# Patient Record
Sex: Female | Born: 1980 | Race: White | Hispanic: No | Marital: Single | Smoking: Never smoker
Health system: Southern US, Community
[De-identification: ages and names within clinical notes are randomized; demographics above are authoritative.]

## PROBLEM LIST (undated history)

## (undated) DIAGNOSIS — K219 Gastro-esophageal reflux disease without esophagitis: Secondary | ICD-10-CM

## (undated) DIAGNOSIS — C801 Malignant (primary) neoplasm, unspecified: Secondary | ICD-10-CM

## (undated) DIAGNOSIS — I8390 Asymptomatic varicose veins of unspecified lower extremity: Secondary | ICD-10-CM

## (undated) HISTORY — DX: Asymptomatic varicose veins of unspecified lower extremity: I83.90

## (undated) HISTORY — PX: TONSILECTOMY, ADENOIDECTOMY, BILATERAL MYRINGOTOMY AND TUBES: SHX2538

## (undated) HISTORY — PX: LEEP: SHX91

## (undated) HISTORY — PX: ABDOMINAL HYSTERECTOMY: SHX81

## (undated) HISTORY — DX: Gastro-esophageal reflux disease without esophagitis: K21.9

## (undated) HISTORY — DX: Malignant (primary) neoplasm, unspecified: C80.1

---
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
## 2001-07-03 ENCOUNTER — Emergency Department (HOSPITAL_COMMUNITY): Admission: EM | Admit: 2001-07-03 | Discharge: 2001-07-03 | Payer: Self-pay | Admitting: Emergency Medicine

## 2001-09-11 ENCOUNTER — Encounter: Payer: Self-pay | Admitting: *Deleted

## 2001-09-11 ENCOUNTER — Inpatient Hospital Stay (HOSPITAL_COMMUNITY): Admission: AD | Admit: 2001-09-11 | Discharge: 2001-09-11 | Payer: Self-pay | Admitting: *Deleted

## 2001-09-15 ENCOUNTER — Inpatient Hospital Stay (HOSPITAL_COMMUNITY): Admission: AD | Admit: 2001-09-15 | Discharge: 2001-09-15 | Payer: Self-pay | Admitting: Obstetrics & Gynecology

## 2001-10-15 ENCOUNTER — Encounter: Payer: Self-pay | Admitting: Obstetrics and Gynecology

## 2001-10-15 ENCOUNTER — Inpatient Hospital Stay (HOSPITAL_COMMUNITY): Admission: AD | Admit: 2001-10-15 | Discharge: 2001-10-15 | Payer: Self-pay | Admitting: Obstetrics and Gynecology

## 2001-10-17 ENCOUNTER — Inpatient Hospital Stay (HOSPITAL_COMMUNITY): Admission: AD | Admit: 2001-10-17 | Discharge: 2001-10-17 | Payer: Self-pay | Admitting: Obstetrics and Gynecology

## 2001-12-27 ENCOUNTER — Inpatient Hospital Stay (HOSPITAL_COMMUNITY): Admission: AD | Admit: 2001-12-27 | Discharge: 2001-12-27 | Payer: Self-pay | Admitting: Obstetrics and Gynecology

## 2002-01-03 ENCOUNTER — Inpatient Hospital Stay (HOSPITAL_COMMUNITY): Admission: AD | Admit: 2002-01-03 | Discharge: 2002-01-03 | Payer: Self-pay | Admitting: Obstetrics and Gynecology

## 2002-07-26 ENCOUNTER — Other Ambulatory Visit: Admission: RE | Admit: 2002-07-26 | Discharge: 2002-07-26 | Payer: Self-pay | Admitting: Family Medicine

## 2002-07-26 ENCOUNTER — Encounter (INDEPENDENT_AMBULATORY_CARE_PROVIDER_SITE_OTHER): Payer: Self-pay | Admitting: *Deleted

## 2002-07-26 ENCOUNTER — Encounter: Admission: RE | Admit: 2002-07-26 | Discharge: 2002-07-26 | Payer: Self-pay | Admitting: Obstetrics and Gynecology

## 2002-10-24 ENCOUNTER — Inpatient Hospital Stay (HOSPITAL_COMMUNITY): Admission: AD | Admit: 2002-10-24 | Discharge: 2002-10-24 | Payer: Self-pay | Admitting: Obstetrics and Gynecology

## 2002-11-13 ENCOUNTER — Other Ambulatory Visit: Admission: RE | Admit: 2002-11-13 | Discharge: 2002-11-13 | Payer: Self-pay | Admitting: Obstetrics and Gynecology

## 2002-12-03 ENCOUNTER — Observation Stay (HOSPITAL_COMMUNITY): Admission: AD | Admit: 2002-12-03 | Discharge: 2002-12-03 | Payer: Self-pay | Admitting: Obstetrics and Gynecology

## 2002-12-26 ENCOUNTER — Other Ambulatory Visit: Admission: RE | Admit: 2002-12-26 | Discharge: 2002-12-26 | Payer: Self-pay | Admitting: Obstetrics and Gynecology

## 2003-03-05 ENCOUNTER — Ambulatory Visit: Admission: RE | Admit: 2003-03-05 | Discharge: 2003-03-05 | Payer: Self-pay | Admitting: Gynecologic Oncology

## 2003-04-25 ENCOUNTER — Other Ambulatory Visit: Admission: RE | Admit: 2003-04-25 | Discharge: 2003-04-25 | Payer: Self-pay | Admitting: Obstetrics and Gynecology

## 2003-07-03 ENCOUNTER — Inpatient Hospital Stay (HOSPITAL_COMMUNITY): Admission: AD | Admit: 2003-07-03 | Discharge: 2003-07-06 | Payer: Self-pay | Admitting: Obstetrics and Gynecology

## 2003-10-18 ENCOUNTER — Encounter (INDEPENDENT_AMBULATORY_CARE_PROVIDER_SITE_OTHER): Payer: Self-pay | Admitting: Specialist

## 2003-10-18 ENCOUNTER — Ambulatory Visit (HOSPITAL_COMMUNITY): Admission: RE | Admit: 2003-10-18 | Discharge: 2003-10-18 | Payer: Self-pay | Admitting: Obstetrics and Gynecology

## 2004-05-07 ENCOUNTER — Emergency Department (HOSPITAL_COMMUNITY): Admission: EM | Admit: 2004-05-07 | Discharge: 2004-05-07 | Payer: Self-pay | Admitting: Emergency Medicine

## 2004-06-26 ENCOUNTER — Other Ambulatory Visit: Admission: RE | Admit: 2004-06-26 | Discharge: 2004-06-26 | Payer: Self-pay | Admitting: Obstetrics and Gynecology

## 2004-12-02 ENCOUNTER — Inpatient Hospital Stay (HOSPITAL_COMMUNITY): Admission: AD | Admit: 2004-12-02 | Discharge: 2004-12-02 | Payer: Self-pay | Admitting: Obstetrics and Gynecology

## 2005-03-01 ENCOUNTER — Emergency Department (HOSPITAL_COMMUNITY): Admission: EM | Admit: 2005-03-01 | Discharge: 2005-03-02 | Payer: Self-pay | Admitting: Emergency Medicine

## 2005-07-06 ENCOUNTER — Emergency Department (HOSPITAL_COMMUNITY): Admission: EM | Admit: 2005-07-06 | Discharge: 2005-07-06 | Payer: Self-pay | Admitting: Emergency Medicine

## 2005-09-03 ENCOUNTER — Emergency Department (HOSPITAL_COMMUNITY): Admission: EM | Admit: 2005-09-03 | Discharge: 2005-09-03 | Payer: Self-pay | Admitting: Family Medicine

## 2006-02-10 ENCOUNTER — Emergency Department (HOSPITAL_COMMUNITY): Admission: EM | Admit: 2006-02-10 | Discharge: 2006-02-10 | Payer: Self-pay | Admitting: Emergency Medicine

## 2006-09-14 ENCOUNTER — Ambulatory Visit: Admission: RE | Admit: 2006-09-14 | Discharge: 2006-09-14 | Payer: Self-pay | Admitting: Gynecologic Oncology

## 2006-12-06 ENCOUNTER — Ambulatory Visit: Admission: RE | Admit: 2006-12-06 | Discharge: 2006-12-06 | Payer: Self-pay | Admitting: Gynecologic Oncology

## 2007-01-24 ENCOUNTER — Emergency Department (HOSPITAL_COMMUNITY): Admission: EM | Admit: 2007-01-24 | Discharge: 2007-01-24 | Payer: Self-pay | Admitting: Emergency Medicine

## 2007-02-18 ENCOUNTER — Inpatient Hospital Stay (HOSPITAL_COMMUNITY): Admission: AD | Admit: 2007-02-18 | Discharge: 2007-02-18 | Payer: Self-pay | Admitting: Obstetrics and Gynecology

## 2007-03-28 ENCOUNTER — Ambulatory Visit: Admission: RE | Admit: 2007-03-28 | Discharge: 2007-03-28 | Payer: Self-pay | Admitting: Gynecologic Oncology

## 2007-04-04 ENCOUNTER — Encounter (INDEPENDENT_AMBULATORY_CARE_PROVIDER_SITE_OTHER): Payer: Self-pay | Admitting: Obstetrics and Gynecology

## 2007-04-04 ENCOUNTER — Inpatient Hospital Stay (HOSPITAL_COMMUNITY): Admission: AD | Admit: 2007-04-04 | Discharge: 2007-04-06 | Payer: Self-pay | Admitting: Obstetrics and Gynecology

## 2007-07-05 ENCOUNTER — Other Ambulatory Visit: Admission: RE | Admit: 2007-07-05 | Discharge: 2007-07-05 | Payer: Self-pay | Admitting: Gynecologic Oncology

## 2007-07-05 ENCOUNTER — Encounter: Payer: Self-pay | Admitting: Gynecologic Oncology

## 2007-07-05 ENCOUNTER — Ambulatory Visit: Admission: RE | Admit: 2007-07-05 | Discharge: 2007-07-05 | Payer: Self-pay | Admitting: Gynecologic Oncology

## 2007-08-04 ENCOUNTER — Encounter (INDEPENDENT_AMBULATORY_CARE_PROVIDER_SITE_OTHER): Payer: Self-pay | Admitting: Obstetrics and Gynecology

## 2007-08-04 ENCOUNTER — Ambulatory Visit (HOSPITAL_COMMUNITY): Admission: RE | Admit: 2007-08-04 | Discharge: 2007-08-04 | Payer: Self-pay | Admitting: Obstetrics and Gynecology

## 2007-08-08 ENCOUNTER — Inpatient Hospital Stay (HOSPITAL_COMMUNITY): Admission: AD | Admit: 2007-08-08 | Discharge: 2007-08-08 | Payer: Self-pay | Admitting: Obstetrics and Gynecology

## 2007-08-12 ENCOUNTER — Emergency Department (HOSPITAL_COMMUNITY): Admission: EM | Admit: 2007-08-12 | Discharge: 2007-08-12 | Payer: Self-pay | Admitting: Family Medicine

## 2007-10-02 ENCOUNTER — Ambulatory Visit (HOSPITAL_COMMUNITY): Admission: RE | Admit: 2007-10-02 | Discharge: 2007-10-02 | Payer: Self-pay | Admitting: Obstetrics and Gynecology

## 2007-11-03 ENCOUNTER — Emergency Department (HOSPITAL_COMMUNITY): Admission: EM | Admit: 2007-11-03 | Discharge: 2007-11-03 | Payer: Self-pay | Admitting: Emergency Medicine

## 2007-11-30 ENCOUNTER — Encounter (INDEPENDENT_AMBULATORY_CARE_PROVIDER_SITE_OTHER): Payer: Self-pay | Admitting: Obstetrics and Gynecology

## 2007-11-30 ENCOUNTER — Ambulatory Visit (HOSPITAL_COMMUNITY): Admission: RE | Admit: 2007-11-30 | Discharge: 2007-12-01 | Payer: Self-pay | Admitting: Obstetrics and Gynecology

## 2007-12-11 ENCOUNTER — Encounter: Admission: RE | Admit: 2007-12-11 | Discharge: 2007-12-11 | Payer: Self-pay | Admitting: Obstetrics and Gynecology

## 2008-04-09 ENCOUNTER — Emergency Department (HOSPITAL_COMMUNITY): Admission: EM | Admit: 2008-04-09 | Discharge: 2008-04-09 | Payer: Self-pay | Admitting: Emergency Medicine

## 2008-06-04 ENCOUNTER — Encounter: Admission: RE | Admit: 2008-06-04 | Discharge: 2008-06-25 | Payer: Self-pay | Admitting: Internal Medicine

## 2008-08-26 ENCOUNTER — Inpatient Hospital Stay (HOSPITAL_COMMUNITY): Admission: AD | Admit: 2008-08-26 | Discharge: 2008-08-27 | Payer: Self-pay | Admitting: Obstetrics & Gynecology

## 2008-12-17 ENCOUNTER — Emergency Department (HOSPITAL_COMMUNITY): Admission: EM | Admit: 2008-12-17 | Discharge: 2008-12-17 | Payer: Self-pay | Admitting: Emergency Medicine

## 2009-05-01 ENCOUNTER — Emergency Department (HOSPITAL_COMMUNITY): Admission: EM | Admit: 2009-05-01 | Discharge: 2009-05-01 | Payer: Self-pay | Admitting: Emergency Medicine

## 2010-02-20 ENCOUNTER — Emergency Department (HOSPITAL_COMMUNITY): Admission: EM | Admit: 2010-02-20 | Discharge: 2010-02-20 | Payer: Self-pay | Admitting: Emergency Medicine

## 2010-04-02 ENCOUNTER — Emergency Department (HOSPITAL_COMMUNITY): Admission: EM | Admit: 2010-04-02 | Discharge: 2010-04-02 | Payer: Self-pay | Admitting: Family Medicine

## 2010-04-24 ENCOUNTER — Emergency Department (HOSPITAL_COMMUNITY): Admission: EM | Admit: 2010-04-24 | Discharge: 2010-04-24 | Payer: Self-pay | Admitting: Emergency Medicine

## 2010-05-29 ENCOUNTER — Emergency Department (HOSPITAL_COMMUNITY): Admission: EM | Admit: 2010-05-29 | Discharge: 2010-05-29 | Payer: Self-pay | Admitting: Emergency Medicine

## 2010-10-23 ENCOUNTER — Ambulatory Visit (HOSPITAL_COMMUNITY): Payer: Self-pay | Attending: Internal Medicine

## 2010-11-03 ENCOUNTER — Ambulatory Visit (HOSPITAL_COMMUNITY): Payer: Self-pay

## 2010-11-05 ENCOUNTER — Emergency Department (HOSPITAL_COMMUNITY)
Admission: EM | Admit: 2010-11-05 | Discharge: 2010-11-05 | Disposition: A | Discharge status: Home / Self Care | Payer: Medicaid Other | Attending: Emergency Medicine | Admitting: Emergency Medicine

## 2010-11-05 DIAGNOSIS — J029 Acute pharyngitis, unspecified: Secondary | ICD-10-CM | POA: Insufficient documentation

## 2010-11-05 LAB — RAPID STREP SCREEN (MED CTR MEBANE ONLY): Streptococcus, Group A Screen (Direct): NEGATIVE

## 2010-11-09 ENCOUNTER — Encounter (HOSPITAL_COMMUNITY): Payer: Self-pay

## 2010-11-20 LAB — GC/CHLAMYDIA PROBE AMP, GENITAL
Chlamydia, DNA Probe: NEGATIVE
GC Probe Amp, Genital: NEGATIVE

## 2010-11-20 LAB — URINALYSIS, ROUTINE W REFLEX MICROSCOPIC
Glucose, UA: NEGATIVE mg/dL
Hgb urine dipstick: NEGATIVE
Nitrite: NEGATIVE
Specific Gravity, Urine: 1.028 (ref 1.005–1.030)
pH: 6 (ref 5.0–8.0)

## 2010-11-20 LAB — URINE CULTURE: Colony Count: 85000

## 2010-11-20 LAB — WET PREP, GENITAL
Clue Cells Wet Prep HPF POC: NONE SEEN
Trich, Wet Prep: NONE SEEN
WBC, Wet Prep HPF POC: NONE SEEN

## 2010-11-24 LAB — URINALYSIS, ROUTINE W REFLEX MICROSCOPIC
Bilirubin Urine: NEGATIVE
Glucose, UA: NEGATIVE mg/dL
Ketones, ur: NEGATIVE mg/dL
Nitrite: POSITIVE — AB
Protein, ur: NEGATIVE mg/dL
Specific Gravity, Urine: 1.018 (ref 1.005–1.030)
Urobilinogen, UA: 0.2 mg/dL (ref 0.0–1.0)

## 2010-11-24 LAB — CBC
HCT: 38.2 % (ref 36.0–46.0)
Platelets: 216 10*3/uL (ref 150–400)

## 2010-11-24 LAB — POCT I-STAT, CHEM 8
Calcium, Ion: 1.13 mmol/L (ref 1.12–1.32)
Chloride: 105 mEq/L (ref 96–112)
Creatinine, Ser: 1 mg/dL (ref 0.4–1.2)
Glucose, Bld: 91 mg/dL (ref 70–99)
HCT: 39 % (ref 36.0–46.0)
Hemoglobin: 13.3 g/dL (ref 12.0–15.0)
TCO2: 24 mmol/L (ref 0–100)

## 2010-11-24 LAB — URINE MICROSCOPIC-ADD ON

## 2010-11-24 LAB — POCT PREGNANCY, URINE: Preg Test, Ur: NEGATIVE

## 2010-11-30 LAB — DIFFERENTIAL
Basophils Absolute: 0 10*3/uL (ref 0.0–0.1)
Basophils Relative: 0 % (ref 0–1)
Eosinophils Absolute: 0.2 10*3/uL (ref 0.0–0.7)
Eosinophils Relative: 2 % (ref 0–5)
Monocytes Absolute: 0.9 10*3/uL (ref 0.1–1.0)
Monocytes Relative: 9 % (ref 3–12)
Neutro Abs: 5.2 10*3/uL (ref 1.7–7.7)

## 2010-11-30 LAB — URINALYSIS, ROUTINE W REFLEX MICROSCOPIC
Bilirubin Urine: NEGATIVE
Urobilinogen, UA: 0.2 mg/dL (ref 0.0–1.0)

## 2010-11-30 LAB — CBC: MCV: 91.7 fL (ref 78.0–100.0)

## 2010-11-30 LAB — WET PREP, GENITAL
Clue Cells Wet Prep HPF POC: NONE SEEN
Trich, Wet Prep: NONE SEEN
Yeast Wet Prep HPF POC: NONE SEEN

## 2010-12-29 NOTE — Consult Note (Signed)
NAMEDEBBIE, [name]                  ACCOUNT NO.:  [id number]   MEDICAL RECORD NO.:  [id number]          PATIENT TYPE:  OUT   LOCATION:  GYN                          FACILITY:  [facility]   PHYSICIAN:  Paola A. Duard Brady, MD    DATE OF BIRTH:  [birth year]   DATE OF CONSULTATION:  03/28/2007  DATE OF DISCHARGE:                                 CONSULTATION   Ms. Frankowski is a 30 year old, gravida 4, para 1 who is currently at 37-5/7  weeks.  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  She was scheduled for a LEEP in December; however,  her pregnancy test was positive that day, the LEEP was canceled and she  was referred to [place]. After a significant amount of difficulty, she was  ultimately reseen by [place] in April 2008.  At that time, she had a  multiparous cervix with a prominent ectropion status post LEEP on  colposcopy. There was an acetowhite epithelial change from 6-10 o'clock  with no mosaicism or abnormality. The  cervix was palpably normal, long  and closed. At that time, she opted for close follow-up and comes in  today to ensure that her cervix is fairly unremarkable prior to  proceeding with a planned vaginal delivery for this pregnancy.  She is  overall doing quite well.  She is anxious and looking forward to her  delivery.  She is having a boy. She and her partner have names picked  out. She has the same father for all three pregnancies and this would be  the completion of their child bearing wishes after this delivery as they  have two girls and now one boy. She states she has had excellent fetal  movement, denies any vaginal bleeding.  She feels that she is having  some watery discharge and may have lost her mucous plug. She is having  Braxton-Hicks contractions. A few weeks ago she had fairly significant  contractions that were somewhat difficult to breathe through that lasted  about 2 hours and abated when she took a warm bath.   PHYSICAL EXAMINATION:   Weight 210 pounds, well-nourished, well-developed  female in no acute distress.  PELVIC:  External genitalia is within normal limits.  The vagina is well  epithelized.  The cervix is multiparous. The atropia is not as  prominent. There appears to be some visual dilatation.  There is no  obvious mucous plug.  The cervix was visually inspected and there was no  gross cervical carcinoma visualized.  Colposcopy was not performed  secondary to discomfort with the pelvic examination. On bimanual  examination, the cervix is palpably normal. The fetus is in the vertex  position and is approximately 40-50% effaced with fingertip dilatation.   ASSESSMENT:  A 30 year old at 37-5/7 weeks with CIN III.   PLAN:  I think she is safe to proceed with vaginal delivery as scheduled  through her primary care physicians.  We will go ahead and schedule her  to return to see [place] in 8 weeks.  At  that time will need to proceed with  a LEEP and  pending the results of LEEP if the patient wishes to proceed with  definitive diagnosis consisting of a hysterectomy that can be discussed  with her.  At her 8-week postpartum check, we will perform Pap smear  colposcopy and directed biopsies.      Paola A. Duard Brady, MD  Electronically Signed     PAG/MEDQ  D:  03/28/2007  T:  03/29/2007  Job:  [id number]   cc:   Telford Nab, R.N.  [address]  [place], [place] [address]   Osborn Coho, M.D.  Fax: [phone]

## 2010-12-29 NOTE — Op Note (Signed)
[name], [name]                  ACCOUNT NO.:  [id number]   MEDICAL RECORD NO.:  [id number]          PATIENT TYPE:  AMB   LOCATION:  SDC                           [facility]ACILITY:  [facility]   PHYSICIAN:  Osborn Coho, M.D.   DATE OF BIRTH:  13-Oct-1980   DATE OF PROCEDURE:  11/30/2007  DATE OF DISCHARGE:                               OPERATIVE REPORT   PREOPERATIVE DIAGNOSES:  Recurrent CIN III.   POSTOPERATIVE DIAGNOSES:  Recurrent CIN III.   PROCEDURE:  1. Total laparoscopic hysterectomy.  2. Cystoscopy.   ATTENDING PHYSICIAN:  Osborn Coho, M.D.   ASSISTANT:  Marquis Lunch. Adline Peals.   ANESTHESIA:  General.   SPECIMEN TO PATHOLOGY:  Uterus and cervix weighing less than 250 gm.   FLUIDS:  2800 mL.   URINE OUTPUT:  200 mL.   ESTIMATED BLOOD LOSS:  200 mL.   COMPLICATIONS:  None.   DESCRIPTION OF PROCEDURE:  The patient was taken to the operating room  after the risks, benefits, and alternatives discussed with the patient.  The patient verbalized understanding and consent signed and witnessed.  The patient was placed under general per anesthesia and prepped and  draped in the normal sterile fashion in the dorsal lithotomy position.  A weighted speculum was placed in the patient's vagina and Foley placed  in the bladder to drain via gravity.  An anterior vaginal wall retractor  was used and the anterior lip of the cervix grasped with a single tooth  tenaculum.  The uterus was sounded to 8 cm and a size 6 probe was placed  on the HUMI intrauterine manipulator.  The 6 cm probe was placed on the  intrauterine manipulator and the 3.5 cm co-ring was placed as well with  the occluder and __________ was then placed into the uterus and the  uterine balloon was filled with 10 mL of normal saline and occluder  filled with 50 to 60 cc of  normal saline.  Attention was then turned to  the abdomen after gowning and regloving.  A 10 mm incision was made at  the umbilicus after injecting  dilute Marcaine.  The Veress needle was  introduced into the intra-abdominal cavity and pneumoperitoneum  achieved.  The 10 mm trocar was then introduced into the intra-abdominal  cavity and camera placed as well.  Attention was then turned to the  right lower quadrant where dilute Marcaine was injected and a 5 mm  incision was made and a 5 mm trocar advanced under direct visualization.  At least approximately 3 cm rostral to this area and medial.  A 10 mm  incision was made and a 10 mm trocar advanced under direct  visualization.  Attention was then turned to the left lower quadrant  where a 5 mm incision was made and a 5 mm trocar advanced under direct  visualization.  The harmonic was then used to cauterize and excise the  utero-ovarian ligament on the right, as well as the fallopian tube down  to the round ligament.  The bladder flap was then created with the  harmonic  on that side.  After switching sides, the harmonic was used in  a similar fashion to cauterize and excise the utero-ovarian on the  patient's left, including the fallopian tube and parametrial tissue down  to the level of the round ligament.  The bladder flap was then created.  The anterior cul-de-sac was then entered and after switching sides  again, the uterine vessels were cauterized and cut on the patient's  recovery room down to the level of the co-ring which was then excised  laterally as well.  The same was done on the patient's left after  switching sides.  There was good blanching of the uterus and the  remaining tissue was excised around the co-ring and the uterus placed  into the vagina with normal occlusion.  Using #1 PDS, approximately 4  stitches were placed on the vaginal cuff and a stitch was also placed  using #1 Vicryl secondary to question of the serosa over the bladder was  lacerated.  There was some bleeding at the right angle which was made  hemostatic with the bipolar Kleppinger's.   Pneumoperitoneum was relieved  and attention was then turned to the perineum.  Using a bivalve  speculum, the cuff was noted to be intact.  After giving the patient  indigo-carmine, cystoscopy was performed using Betadine liberally and  bilateral ureters were noted to efflux without difficulty.  There was no  inadvertent bladder injuries noted and no stitches in the bladder.  The  Foley was placed to gravity.  Attention was then turned again to the  abdomen after regloving and the Foley was clamped so as not to drain  into the Foley bag and the bladder was filled retrograde with dilute  indigo-carmine, and no leaking was noted while observing via the  laparoscope.  There was good hemostasis at all pedicle sites and at the  vaginal cuff.  Pneumoperitoneum was relieved.  Suction  irrigation  performed.  All trocars were removed under direct visualization,  including the umbilical trocar.  The umbilical 2-mm incision was  repaired on the fascia using  3-0 Vicryl via a running stitch.  The  right mid quadrant 10 mm incision was repaired on the fascial using 0-  Vicryl via a running stitch as well.  The two 10 mm incisions were  repaired with 3-0 Monocryl via a subcuticular stitch and all incisions  were dressed with Dermabond.  Using the bivalve speculum, the cuff was  looked at once again and noted to be adequately intact.  The Foley was  left to gravity.  Sponge, lap and needle count were correct.  The  patient tolerated the procedure well and was returned to the recovery  room in good condition.      Osborn Coho, M.D.  Electronically Signed     AR/MEDQ  D:  11/30/2007  T:  11/30/2007  Job:  [id number]
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
## 2010-12-29 NOTE — Consult Note (Signed)
NAMEADDILEE, NEU                  ACCOUNT NO.:  [id number]   MEDICAL RECORD NO.:  [id number]          PATIENT TYPE:  OUT   LOCATION:  GYN                          FACILITY:  [facility]   PHYSICIAN:  Paola A. Duard Brady, MD    DATE OF BIRTH:  Feb 10, 1981   DATE OF CONSULTATION:  06/27/2007  DATE OF DISCHARGE:                                 CONSULTATION   REASON FOR CONSULTATION:  [name] is a 30 year old gravida 4, para 3  who currently is about 3-1/2 months postpartum from a little boy,  [name].  We saw her last August of 2008.  She had a biopsy done at 10  weeks that revealed CIN III.  She was scheduled for a LEEP. However her  pregnancy test was positive on the day of her LEEP visit.  We decided to  follow her during the pregnancy and treat her postpartum. There had been  some delay in her coming to see [place], and her son [name] is now 3-1/2  months old.  She comes in today for Pap, colp, and biopsies. She is  overall doing fairly well.  She is bottle-feeding. She is without  complaints.  She is currently not sexually active but is not using  anything for planned contraception.   PHYSICAL EXAMINATION:  VITAL SIGNS:  Weight 190 pounds.  GENERAL APPEARANCE:  Well-nourished, alert female in no acute distress.  PELVIC:  External genitalia is within normal limits.  The vagina is well  healed.  The cervix is multiparous.  She has a large prominent  ectropium.  ThinPrep Pap was submitted without difficulty.  Colposcopic  evaluation after the application of acetic acid reveals an acetowhite  epithelial change from 12 o'clock to 8 o'clock.  Cervical biopsies at 1  and 6 o'clock were performed.  There is minimal bleeding from the 1  o'clock biopsy. There was a moderate amount of bleeding from the 6  o'clock biopsy.  An ECC was performed.   ASSESSMENT:  A 30 year old with a history of cervical intraepithelial  neoplasia III that was served during the pregnancy who comes in today  for evaluation.   PLAN:  Will follow up the results of her Pap smear, her ECC, and her  biopsies from today.  If there is no significant dysplasia  she would  still like to proceed with hysterectomy due to her recurrent dysplasia.  If there is significant dysplasia, we may need to proceed with a LEEP  prior to considering hysterectomy.      Paola A. Duard Brady, MD  Electronically Signed     PAG/MEDQ  D:  07/05/2007  T:  07/06/2007  Job:  [id number]   cc:   Telford Nab, R.N.  [address]  [place], [place] [address]   Osborn Coho, M.D.  Fax: [phone]

## 2010-12-29 NOTE — H&P (Signed)
[name], [name]                  ACCOUNT NO.:  [id number]   MEDICAL RECORD NO.:  [id number]           PATIENT TYPE:   LOCATION:                                [facility]   PHYSICIAN:  Osborn Coho, M.D.   DATE OF BIRTH:  05-Oct-1980   DATE OF ADMISSION:  DATE OF DISCHARGE:                              HISTORY & PHYSICAL   HISTORY OF PRESENT ILLNESS:  Ms. Serratore in the 30 year old single white  female para 3-0-1-3 who presents for a total laparoscopic hysterectomy  because of recurrent CIN III.  For the past 8 years the patient has had  recurrent/persistent CIN III in spite of 2 LEEP procedures in 2005 and  2008 respectively.  She denies any postcoital bleeding, pelvic pain,  vaginitis symptoms, urinary tract symptoms, or changes in her bowel  movements.  A pelvic ultrasound in February of 2009 showed a uterus  measuring 7.07 x 6.71 x 3.67 cm and normal-appearing ovaries  bilaterally.  Her gonorrhea and chlamydia cultures were negative.  The  patient received a review of management options for her condition; to  include, continued monitoring, treatment as previously performed, and  hysterectomy.  Due to the persistent nature of nature of her condition,  and the patient's desire not to have any more children.  She wishes to  proceed with definitive therapy in the form of hysterectomy.  Throughout  these years the patient received 2 consultations with a GYN/Oncologist  and they concur with the patient's decision to proceed with  hysterectomy.   PAST MEDICAL HISTORY/OB HISTORY:  Gravida 4, para 3-0-1-3.  The patient  had a spontaneous vaginal delivery in 2001, 2004, and 2008 and an  elective AB in 2002.   GYN HISTORY:  Menarche 30 years old.  The patient uses abstinence as a  method of contraception.  Last menstrual period November 06, 2007.  She has  a remote history of chlamydia.  Has a history of herpes simplex virus  #2.  The patient's last Pap smear which was November 2008,  prior to her  LEEP procedure, showed atypical squamous cells in which high-grade  squamous intraepithelial lesion could not be excluded.   MEDICAL HISTORY:  Positive for depression and anxiety.   SURGICAL HISTORY:  1. In 1989 tonsillectomy.  2. In 2005 LEEP procedure.  3. In 2008 LEEP procedure.   FAMILY HISTORY:  Cardiovascular disease, emphysema, hypertension,  stroke, Factor V deficiency, asthma, diabetes, renal stones, psychiatric  illnesses, and diabetes.   SOCIAL HISTORY:  The patient lives with a friend.  She is single and  unemployed habits.   HABITS:  She does not use tobacco, alcohol or illicit drugs.   CURRENT MEDICATIONS:  1. Hydrochlorothiazide 25 mg.  2. Zoloft unknown mg.   ALLERGIES:  The patient has no known drug allergies.   REVIEW OF SYSTEMS:  The patient does wear glasses.  Denies any chest  pain, shortness of breath, fever, nausea, vomiting, diarrhea, headache,  vision changes; and except as is mentioned in history of present  illness, the patient's review of systems is negative.  PHYSICAL EXAM:  VITAL SIGNS:  Blood pressure 110/76.  Weight is 193.  Height is 5 feet 5 inches tall.  HEENT:  Pupils are equal.  Hearing normal.  Throat clear.  NECK:  Supple without masses.  There is no thyromegaly or cervical  adenopathy.  HEART:  Regular rate and rhythm.  LUNGS:  Clear.  BACK:  No CVA tenderness.  ABDOMEN:  No tenderness, masses, or organomegaly.  EXTREMITIES:  No clubbing, cyanosis or edema.  NEUROLOGIC EXAM:  Within normal limits (cursory exam).  PELVIC EXAM:  EG/BUS is normal.  Vagina is normal.  Cervix is nontender  without lesions.  Uterus normal size, shape and consistency, nontender.  Adnexa no tenderness or masses.   IMPRESSION:  Recurrent cervical intraepithelial neoplasia III.   DISPOSITION:  A discussion was held with the patient regarding the  indications for her procedure, along with its risks which include; but  are not limited to,  reaction to anesthesia, damage to adjacent organs,  infection, and excessive bleeding.  The patient has consented to proceed  with a total laparoscopic hysterectomy with the possibility of a  laparoscopically assisted vaginal hysterectomy or a total abdominal  hysterectomy at [facility] [place] on November 30, 2007 at 9:30  a.m.      [place] J. Adline Peals.      Osborn Coho, M.D.  Electronically Signed    EJP/MEDQ  D:  11/25/2007  T:  11/25/2007  Job:  [id number]

## 2010-12-29 NOTE — Op Note (Signed)
[name], [name]                  ACCOUNT NO.:  [id number]   MEDICAL RECORD NO.:  [id number]          PATIENT TYPE:  AMB   LOCATION:  SDC                           [facility]:  [facility]   PHYSICIAN:  Osborn Coho, M.D.   DATE OF BIRTH:  30-Jan-1981   DATE OF PROCEDURE:  08/04/2007  DATE OF DISCHARGE:                               OPERATIVE REPORT   PREOPERATIVE DIAGNOSIS:  CIN III.   POSTOPERATIVE DIAGNOSIS:  CIN III.   PROCEDURE:  1. LEEP.  2. Colposcopy.   ATTENDING:  Osborn Coho, M.D.   ANESTHESIA:  MAC with local.   FINDINGS:  Acetowhite lesion at 10 to 3 o'clock.   SPECIMEN TO PATHOLOGY:  Cervical LEEP specimen.   FLUIDS:  600 mL.   URINE OUTPUT:  Small amount via straight cath prior to procedure.   ESTIMATED BLOOD LOSS:  Minimal.   COMPLICATIONS:  None.   PROCEDURE IN DETAIL:  The patient was taken to the operating room after  risks, benefits, and alternatives were reviewed with the patient.  The  patient verbalized understanding and consent signed and witnessed.  The  patient was given a paracervical block using a total of 19 mL of 1%  lidocaine.  The patient was also placed under MAC per anesthesia.  The  anterior lip of the cervix was grasped with a single tooth tenaculum and  LEEP performed with 20 by 12 mm size loop.  The specimen was tagged at  12 o'clock with a stitch.  The bed of the cervix was cauterized with the  ball cautery and Monsel's solution applied.  There was good hemostasis.  The cervical os was patent.  All instruments were removed.  Sponge, lap  and needle counts were correct.  The patient tolerated the procedure  well and was awaiting transfer to the recovery room in good condition.     Osborn Coho, M.D.  Electronically Signed    AR/MEDQ  D:  08/04/2007  T:  08/05/2007  Job:  [id number]

## 2011-01-01 NOTE — Consult Note (Signed)
Woods, Tabitha                  ACCOUNT NO.:  [id number]   MEDICAL RECORD NO.:  [id number]          PATIENT TYPE:  OUT   LOCATION:  GYN                          FACILITY:  [facility]   PHYSICIAN:  Tabitha A. Duard Brady, MD    DATE OF BIRTH:  29-Nov-1980   DATE OF CONSULTATION:  09/14/2006  DATE OF DISCHARGE:                                 CONSULTATION   HISTORY OF PRESENT ILLNESS:  The patient is seen today in consultation  at the request of Dr. Su Woods.  [name] is a 30 year old gravida 4,  para 2, abortus 1 who is currently at approximately [redacted] weeks gestation.  She was seen by Dr. Su Woods June 14, 2006.  At that time, she had  cervical biopsies that were benign.  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  She was scheduled for a LEEP on August 14, 2006.  However, her pregnancy test was positive.  The LEEP was  cancelled, and she is referred to [place].  She has previously been seen in  our practice with her last pregnancy in 2004.  She was also diagnosed  with high-grade dysplasia, could not rule out invasive cancer.  She was  seen by Dr. Kyla Woods at that time and she opted to keep the pregnancy.  The  date of her consultation with Dr. Kyla Woods was in July 2004.  At that time,  she was scheduled to have a conization but was incarcerated and was not  able to follow up and then was pregnant.  There was a lengthy discussion  at that time with the patient.  She was 22 weeks, and it was decided to  proceed with close follow-up and colposcopy.  She did have a spontaneous  vaginal delivery and underwent a conization after that pregnancy which  revealed CIN III with no invasive disease per her records.  Her Pap  smears, per her report, have been normal since that time until recently.  She states that while this is an unplanned pregnancy, it is a very much  desired pregnancy, and she has had no problems.  She did  have an  ultrasound 2-3 weeks ago which showed a normal developing gestation at 7  weeks and 5 days.   PAST MEDICAL HISTORY:  None.   PAST SURGICAL HISTORY:  1. LEEP.  2. Tonsillectomy.   MEDICATIONS:  None.   ALLERGIES:  None.   SOCIAL HISTORY:  She denies tobacco or alcohol.  She is single.  She is  a stay-at-home mom.   FAMILY HISTORY:  Her mom and dad both have diabetes and coronary  disease.   PHYSICAL EXAMINATION:  VITAL SIGNS:  Height 5 feet 5, weight 177 pounds,  blood pressure 110/62, pulse 84, respirations 18.  GENERAL:  Well-nourished, alert female in no acute distress.  ABDOMEN:  soft and nontender.  PELVIC:  External genitalia is within normal limits.  Vagina is well  epithelialized.  The cervix is multiparous.  It is status post LEEP  or  conization.  Colposcopic evaluation performed after the application of  acetic acid.  On colposcopic evaluation, there is acetowhite epithelial  change from 8 to 11 o'clock.  There is no associated mosaicism.  There  is no punctation.  There is no events of invasive disease.   BIMANUAL EXAMINATION:  The cervix is palpably normal, corpus is slightly  globular.  There are no adnexal masses.   ASSESSMENT:  A 30 year old with CIN III both on cervical biopsy and  endocervical curettage who is approximately [redacted] weeks pregnant and very  much wishes to maintain this pregnancy.  I discussed with her that if  she wished to proceed with a termination, then definitive therapy could  be completed at that time.  She wants to maintain this pregnancy, is not  interested in a conization at this time.  I discussed with her that  while at this point I do not feel that she has invasive disease, this  could become an invasive lesion during the pregnancy.  We will have to  follow her very closely every trimester, and if there is an issue of  concern of invasive disease, then it will have to be managed  accordingly.  She understands that if that is  the case, that invasive  disease could occur during this pregnancy and that would have  implications both for her and her developing fetus.  She very much,  after a lengthy discussion, wishes to maintain this patient.  She  understands the risks that are entailed in doing so.  She also  understands the need for very close follow-up and definitive treatment  including a LEEP or conization after this pregnancy.  She states that  while she will accept a LEEP or cone after this pregnancy, she does feel  that she does not want any more than three children, and she would be  interested in the hysterectomy after this pregnancy to ensure that she  does not have any recurrent dysplasia in the future.  She will have  three children that she needs to raise and take care of.  I think this  is a reasonable option for her, but of course, we will need to proceed  with excisional procedure prior to that time.  Her questions were  elicited and answered to her satisfaction.  She was given an appointment  to come back to see me in early April at which time we will perform Pap  smear and colposcopic evaluation.  If she should change her mind in the  interim, she knows to contact us.      Tabitha A. Duard Brady, MD  Electronically Signed     PAG/MEDQ  D:  09/14/2006  T:  09/14/2006  Job:  [id number]   cc:   [name], M.D.  Fax: [phone]   Telford Nab, R.N.  [address]  [place], [place] [address]

## 2011-01-01 NOTE — H&P (Signed)
NAMECICLALY, MULCAHEY                  ACCOUNT NO.:  [id number]   MEDICAL RECORD NO.:  [id number]          PATIENT TYPE:  OUT   LOCATION:  GYN                          FACILITY:  [facility]   PHYSICIAN:  Paola A. Duard Brady, MD    DATE OF BIRTH:  01-Aug-1981   DATE OF ADMISSION:  12/06/2006  DATE OF DISCHARGE:                              HISTORY & PHYSICAL   HISTORY OF PRESENT ILLNESS:  Ms. Mogel is a 30 year old gravida 4, para  2, abortus 1 who is currently at 21-5/7 weeks with a due date of April 13, 2007.  I last saw her on September 14, 2006, at which time she was 10  weeks' pregnant.  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  She was scheduled for a LEEP for August 14, 2006.  However, her pregnancy test was positive that day, the LEEP was  cancelled and she was referred to [place].  While this was an unplanned  pregnancy, it was very much a desired pregnancy and, after extensive  counseling which is documented in my note from September 14, 2006, she  wished to proceed and maintain the pregnancy with close follow-up.  She  did miss an appointment with me for November 15, 2006.  We had no contact  number for her; she was sent a certified letter which was returned.  We  subsequently were able to contact her by sending a certified letter to  her mother.  She comes in.  She is, overall, doing quite well.  She  states she has good fetal movement.  She denies any bleeding.  She does  complain of occasional pressure, particularly postcoital pressure with  some urgency but there has been no postcoital bleeding.  She denies any  rupture of membranes.  She states in many ways this pregnancy has been  easier than her prior two pregnancies.  If this is a boy his name will  be Ivin Booty   PHYSICAL EXAMINATION:  VITAL SIGNS:  Weight 182 pounds.  GENERAL:  Well-nourished, alert female in no acute distress.  PELVIC:   External genitalia is within normal limits.  The vagina is well-  epithelialized.  The cervix is multiparous.  There is a prominent-  appearing ectropia and the cervix is also status post LEEP.  Colposcopy  was performed after the application of acetic acid.  There is an  acetowhite epithelial change from approximately 6 o'clock to 10 o'clock.  There is no mosaicism or abnormal vascularity.  The change appears to be  confined to primarily the posterior left lower quadrant of the cervix.  On bimanual examination the cervix is palpably normal, long and closed.  The fetus appears to be in vertex.   ASSESSMENT:  This is a 30 year old at approximately 21-5/7 weeks with  CIN III with a positive endocervical curettage.  She is being watched  closely throughout the pregnancy.  The need for very close follow-up and  compliance with her appointments was stressed to the  patient.  She  understands.  She states she had moved and was actually at her other  physician's office the day she missed her appointment and forgot to call  us, etc.  We now have contact information including a cell phone number  which is [phone].  She does not have a home phone.  We may also use her  mom, Ruby's, phone number of [phone].  We will schedule an appointment  for mid July which is at approximately 34 weeks, to ensure there has  been no developing invasive component prior to her anticipating a  vaginal delivery.  She knows that if she develops any bleeding or other  symptomatology to contact her primary OB/GYN.  Postpartum she  understands the need for follow-up and a LEEP.  She states that she  would like to have a  definitive treatment for her cervical dysplasia consisting of a  hysterectomy.  If there is no invasive component on her LEEP, this can  be performed by her primary OB/GYN.  She will return to see [place] in mid  July.  She was instructed to call us at the end of May for an  appointment.      Paola A.  Duard Brady, MD  Electronically Signed     PAG/MEDQ  D:  12/06/2006  T:  12/06/2006  Job:  [id number]   cc:   Osborn Coho, M.D.  Fax: [phone]   Telford Nab, R.N.  [address]  [place], [place] [address]

## 2011-01-01 NOTE — Consult Note (Signed)
[name], [name]                            ACCOUNT NO.:  [id number]   MEDICAL RECORD NO.:  [id number]                   PATIENT TYPE:  OUT   LOCATION:  GYN                                  FACILITY:  [facility]   PHYSICIAN:  John T. [name], M.D.                 DATE OF BIRTH:  25-Mar-1981   DATE OF CONSULTATION:  03/05/2003  DATE OF DISCHARGE:                                   CONSULTATION   CHIEF COMPLAINT:  This 30 year old para 1-0-1-1 woman is evaluated at the  request of Dr. Dois Davenport Rivard for consultation regarding management of high-  grade SIL complicating pregnancy.   HISTORY OF PRESENT ILLNESS:  The patient relates a history of prior abnormal  Pap smear during a pregnancy three years ago with no details of the degree  of abnormality or treatment.  She denies condylomata or STD.  She had her  last menstrual period on September 21, 2002, which she was found to have an  abnormal Pap smear once with her prenatal evaluation.  She had  colposcopically-directed biopsies in May 2004 that revealed severe dysplasia  associated with endocervical glandular extensive, with a comment that the  extensive degree of high-grade dysplasia is emphasized and additional  studies should be considered to exclude ... focal invasive disease.  The  patient was to undergo conization but because of incarceration was unable to  follow up until February 14, 2003.  At that time, she was found to have several  patches of acetowhite epithelium with mosaicism and punctation.  Repeat  biopsies compatible again with severe dysplasia/CIN 3 and concern for a more  severe later.  The patient appears to understand the implications of a  cervical dysplasia, including the fact that it might progress to malignancy.   PAST MEDICAL HISTORY:  1. Low-grade dysplasia.  Not actively treated in the past.  No major     comorbidities.  2. Status post TAB and NSVD.   FAMILY HISTORY:  There is a family history of factor V deficiency,  but the  patient has tested negative.  There is no breast or colon cancer, and no  ovarian cancer.   MEDICATIONS:  Prenatal vitamins.   ALLERGIES:  None.   REVIEW OF SYSTEMS:  Otherwise noncontributory.   PHYSICAL EXAMINATION:  VITAL SIGNS:  Weight 170 pounds, blood pressure  110/70.  GENERAL:  The patient is alert and oriented x3, in no acute distress.  NECK:  There is no pathological lymphadenopathy.  ABDOMEN:  Benign with a 20 week sized uterus.  Fetal motion is detectable.  Uterus is nontender, and there is no ascites.  EXTREMITIES:  Full range of motion and no edema.  PELVIC:  External genitalia and BUS are normal to inspection and palpation.  The bladder and the rectum are well supported.  There is redundant mucosa  compatible with pregnancy.  Cervix is normal to  inspection and palpation.  Bimanual and rectovaginal examinations feel normal cervical consistency, 20+  weeks sized uterus, and no evidence of parametrial extension.   PROCEDURE NOTE:  After informed consent was obtained, I performed colposcopy  of the upper vagina and cervix.  There was an anterior patch of acetowhite  epithelium at the transformation zone without significant atypical vascular  pattern.  Between 2 and 4 o'clock, acetowhite epithelium with punctation  extended into the canal.  At between 4 and 7 o'clock, acetowhite with  mosaicism and punctation extended into the canal.  No atypical vascularity  suggesting invasive disease was seen.  With manipulation using a Q-tip,  upper limits of the lesions could be delineated.   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   RECOMMENDATIONS:  I had a long discussion with the patient, and it is  apparent she does have a functional understanding of the importance of  followup.  She is aware that under-treatment of CIN could lead to a missed  diagnosis of dysplasia or could miss the diagnosis of an invasive cancer,  which  might lead to her death.  At this juncture, cervical conization would  not be advisable as she would have a high risk for developing ruptured  membranes or chorioamnionitis.  I recommended that the patient undergo  cervical cytology at approximately [redacted] weeks gestation and, unless cytology  suggested invasive disease, would perform colposcopy at 31-32 weeks.  I  would not perform conization or attempt to deliver her early unless there  were cytologic or colposcopic evidence that suggested invasive disease.  Otherwise, I would allow her deliver spontaneously and would perform re-  evaluation and probable cervical conization when she had completed six to  eight weeks of convalescence from delivery.                                               John T. [name], M.D.    JTS/MEDQ  D:  03/05/2003  T:  03/05/2003  Job:  [id number]   cc:   Dois Davenport A. Rivard, M.D.  [address]  [place]  [place] [address]  Fax: [phone]   Telford Nab, R.N.  [phone] [address]  [place], [place] [address]

## 2011-01-01 NOTE — Op Note (Signed)
[name], [name]                            ACCOUNT NO.:  [id number]   MEDICAL RECORD NO.:  [id number]                   PATIENT TYPE:  AMB   LOCATION:  SDC                                  FACILITY:  [facility]   PHYSICIAN:  Dois Davenport A. Rivard, M.D.              DATE OF BIRTH:  12-Jan-1981   DATE OF PROCEDURE:  10/18/2003  DATE OF DISCHARGE:                                 OPERATIVE REPORT   PREOPERATIVE DIAGNOSIS:  Severe cervical dysplasia, cervical intraepithelial  neoplasia III.   POSTOPERATIVE DIAGNOSIS:  Severe cervical dysplasia, cervical  intraepithelial neoplasia III.   ANESTHESIA:  Paracervical block, Dr. Estanislado Pandy.   PROCEDURE:  Loop electrosurgical excision procedure.   SURGEON:  Crist Fat. Rivard, M.D.   ESTIMATED BLOOD LOSS:  Minimal.   DESCRIPTION OF PROCEDURE:  After being informed of the planned procedure  with possible complications including bleeding, infection, cervical  stenosis, cervical incompetence, and 10% failure of treatment, informed  consent was obtained.  The patient is taken to OR #4 and placed in the  lithotomy position.  A warm speculum is inserted.  Her cervix is prepped  with acetic acid and colposcopy identified the lesion to be removed.  This  lesion had been previously biopsied under colposcopy revealing CIN-III.  Using a medium-sized loop, we proceed with LEEP, excising the whole  identified lesion in two passes, first with anterior lip, then with  posterior lip.  The section surface is then cauterized with roller ball and  Monsel's is applied.  Hemostasis is adequate.  Instruments are removed.  Instrument and sponge count is complete x2.  Estimated blood loss is  minimal, and the procedure is well-tolerated by the patient, who is taken to  the recovery room for discharge home in a well and stable condition.  The  specimen was identified with 12, 3, 6, and 9 o'clock margins for the  pathologist.     Crist Fat. Rivard, M.D.    SAR/MEDQ  D:  10/18/2003  T:  10/19/2003  Job:  [id number]
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
## 2011-01-01 NOTE — Discharge Summary (Signed)
[name], [name]                  ACCOUNT NO.:  [id number]   MEDICAL RECORD NO.:  [id number]          PATIENT TYPE:  OIB   LOCATION:  9311                          FACILITY:  [facility]   PHYSICIAN:  Osborn Coho, M.D.   DATE OF BIRTH:  [birth year]   DATE OF ADMISSION:  11/30/2007  DATE OF DISCHARGE:  12/01/2007                               DISCHARGE SUMMARY   DISCHARGE DIAGNOSES:  1. Recurrent cervical intraepithelial neoplasia III.  2. Depression.   OPERATION:  On the date of admission, the patient underwent a total  laparoscopic hysterectomy followed by cystoscopy, tolerating procedure  well.   HISTORY OF PRESENT ILLNESS:  [name] a 30 year old single white female  para 3-0-1-3, who presents for a total laparoscopic hysterectomy because  of recurrent CIN III.  Please see the patient's dictated history and  physical examination for details.   PREOPERATIVE PHYSICAL EXAM:  VITAL SIGNS:  Blood pressure 110/76, weight  is 193, and height is 5 feet 5 inches tall.  GENERAL EXAM:  Within normal limits.  PELVIC EXAM:  EGBUS is normal.  Vagina is normal.  Cervix is nontender  without lesions.  Uterus normal size, shape, and consistency without  tenderness.  Adnexa without tenderness or masses.   HOSPITAL COURSE:  On the date of admission, the patient underwent  aforementioned procedures, tolerating them all well.  Postoperative  course was unremarkable with the patient tolerating the postop  hemoglobin of 10.8 (preop hemoglobin 13.2) and resuming bowel and  bladder function by postop day #1.  She was therefore deemed ready for  discharge home.   DISCHARGE MEDICATIONS:  1. Percocet 5/325 1-2 tablets every 4 hours as needed for pain.  2. Ibuprofen 600 mg with food every 6 hours for 5 days then as needed      for pain.  3. Colace 100 mg twice daily until bowel movements are regular.  4. Zoloft 25 mg daily.   FOLLOW UP:  The patient is scheduled for a followup visit with Dr.  Su Hilt  on December 08, 2007 at 10:30 a.m.  She is then also scheduled for  a 6 weeks postoperative visit on Jan 11, 2008 at 3 p.m. with Dr.  Su Hilt.   DISCHARGE INSTRUCTIONS:  The patient was given a copy of [facility] postoperative instruction sheet.  She was further  advised to increase her  iron rich foods, to avoid driving for 1 week,  heavy lifting for 6 weeks (nothing greater than 10 pounds), sexual  activity for 6 weeks, that she may shower, she may walkup steps.  The  patient's diet was without restriction.   FINAL PATHOLOGY:  Uterus and cervix:  Cervix - focal inflammation and  fibrosis consistent with previous procedure.  No residual dysplasia  identified; endometrium - secretory no evidence of hyperplasia or  malignancy and myometrium and uterine serosa - unremarkable.      [name].      Osborn Coho, M.D.  Electronically Signed    EJP/MEDQ  D:  12/26/2007  T:  12/27/2007  Job:  [id number]
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
## 2011-04-04 IMAGING — CR DG SINUSES COMPLETE 3+V
4 series · 4 of 4 positions shown · non-contrast
Comparison: None.

CLINICAL DATA: Pain in throat, ears and sinuses for 2 months.

PARANASAL SINUSES - COMPLETE 3 + VIEW

[view not recorded (1 of 4)]
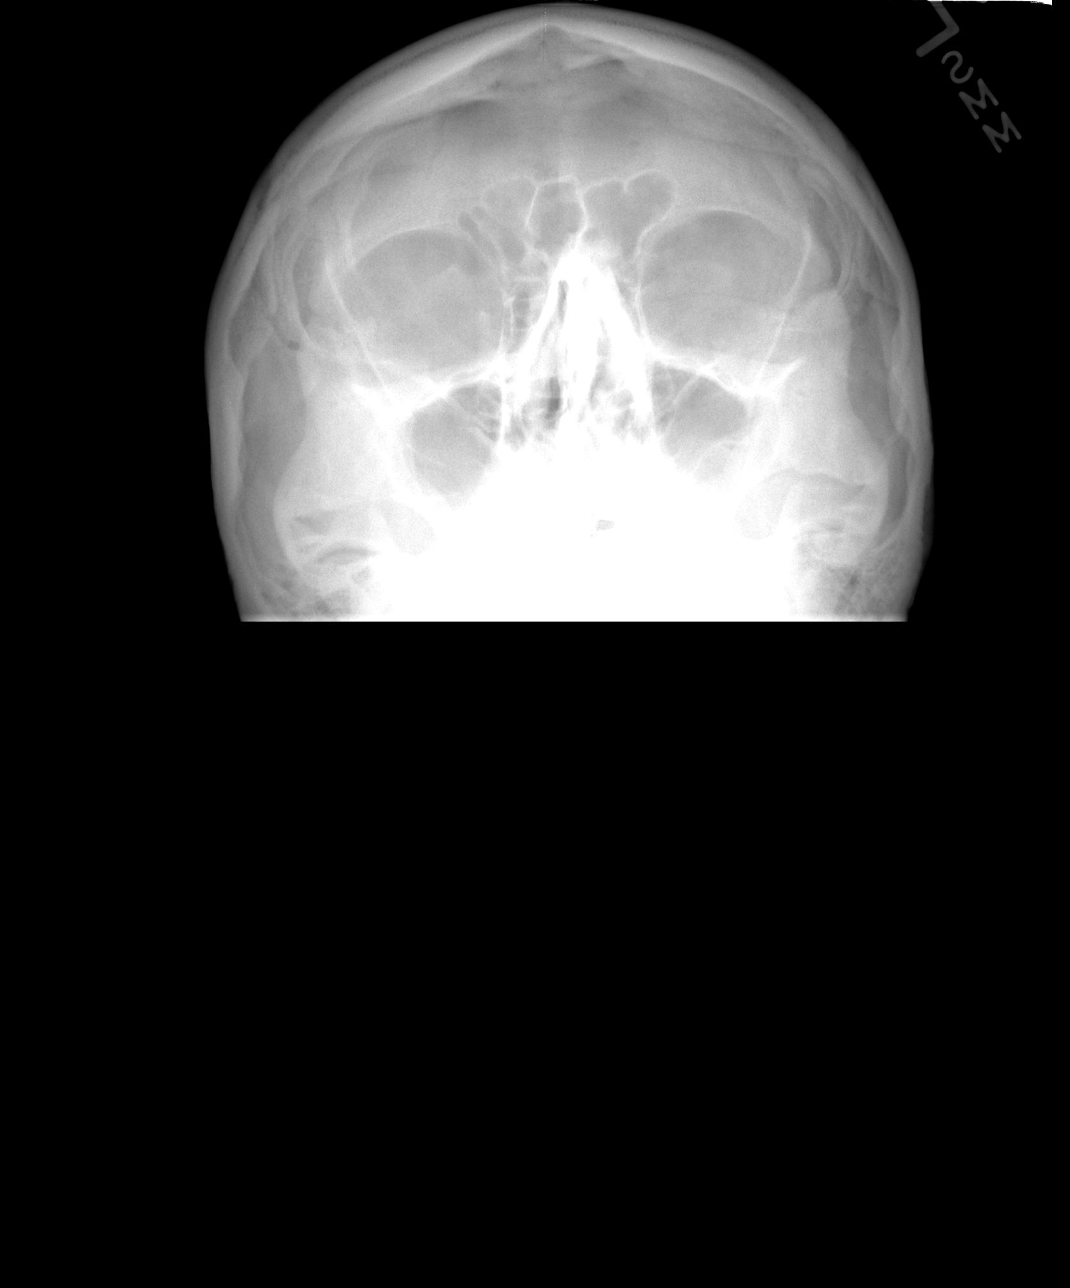

[view not recorded (2 of 4)]
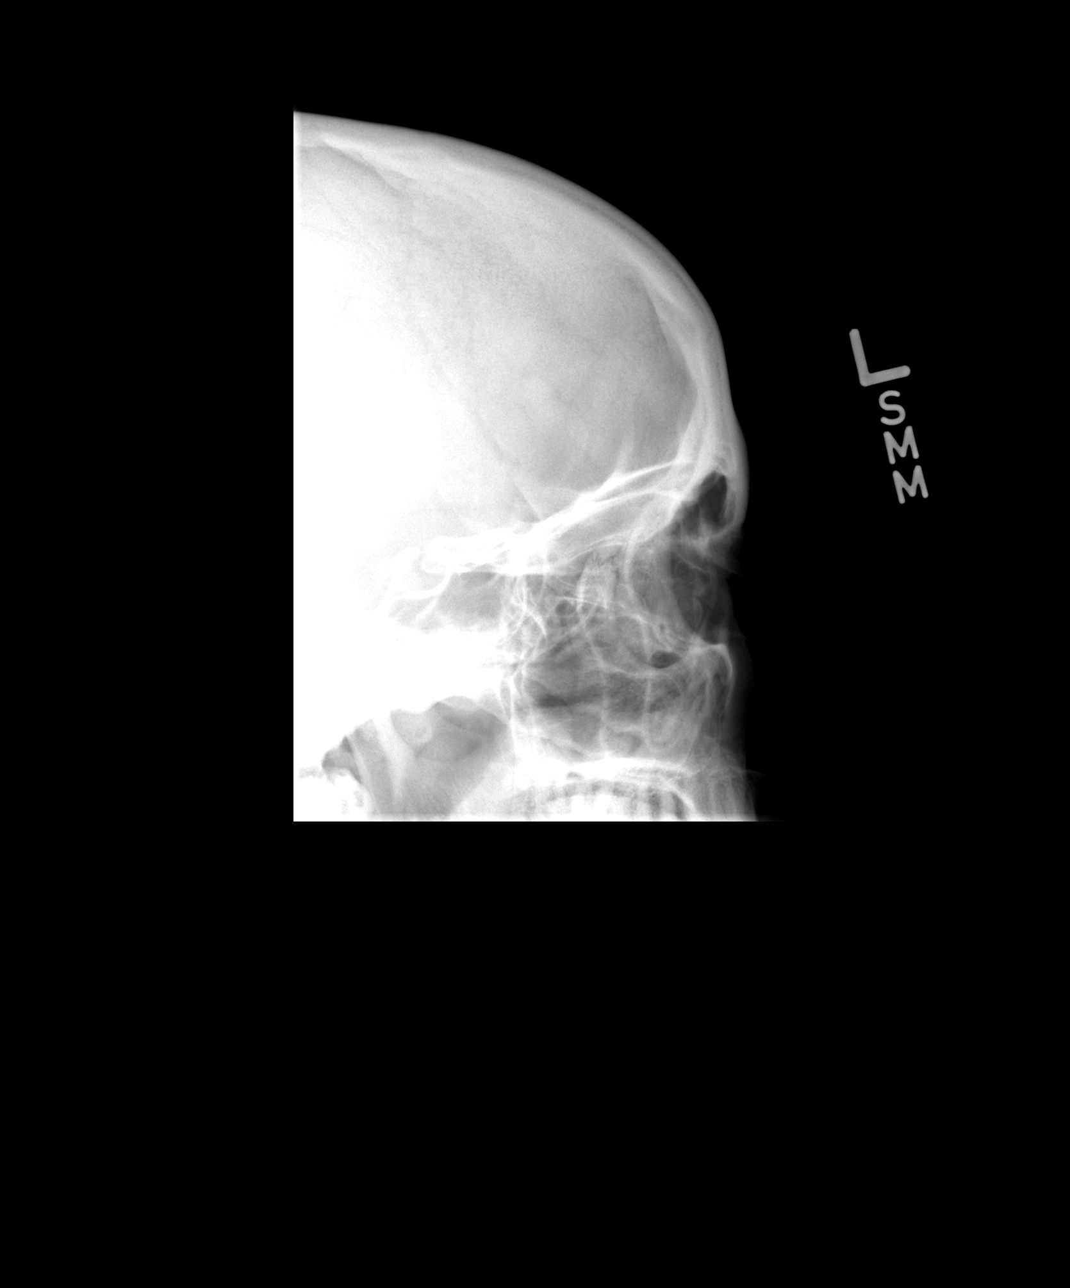

[view not recorded (3 of 4)]
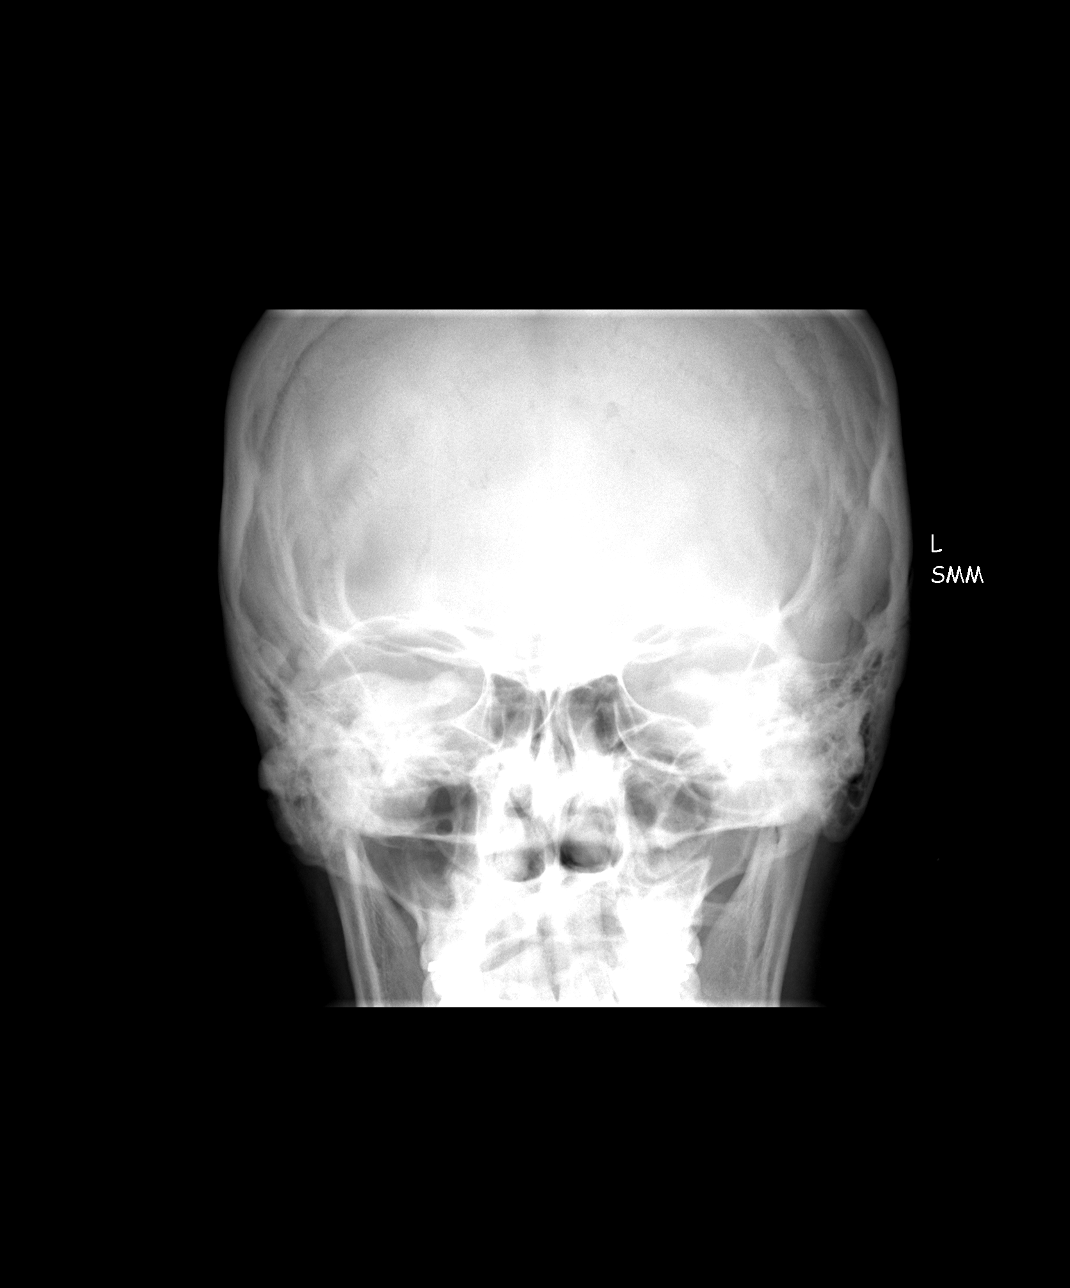

[view not recorded (4 of 4)]
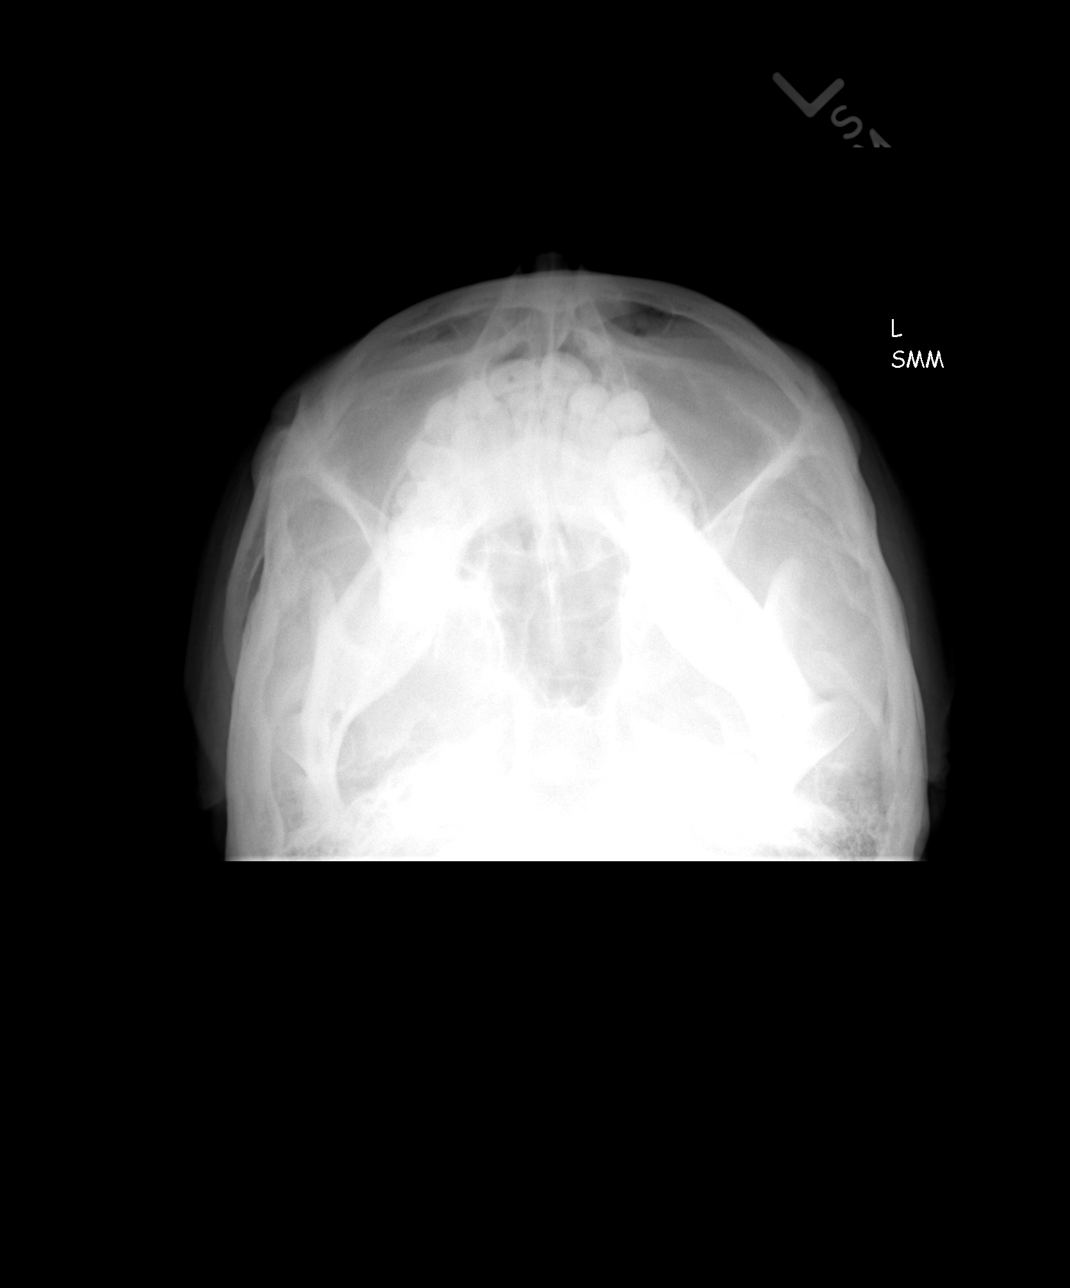

[4 of 4 positions shown; findings below may reference images not displayed]

FINDINGS: By plain film examination, paranasal sinuses appear
clear.
IMPRESSION: By plain film examination, paranasal sinuses appear clear.

## 2011-05-11 LAB — CBC
MCHC: 34.6
MCHC: 35
MCV: 87.6
MCV: 89.2
Platelets: 176
RBC: 4.31
RDW: 13.5
WBC: 13.3 — ABNORMAL HIGH

## 2011-05-11 LAB — BASIC METABOLIC PANEL
BUN: 4 — ABNORMAL LOW
Calcium: 8.4
Chloride: 110
Creatinine, Ser: 0.59

## 2011-05-11 LAB — URINE CULTURE

## 2011-05-11 LAB — HCG, SERUM, QUALITATIVE: Preg, Serum: NEGATIVE

## 2011-05-21 LAB — CBC
Platelets: 300
Platelets: 317
RDW: 12.6
RDW: 12.9

## 2011-05-21 LAB — POCT PREGNANCY, URINE
Operator id: 26329
Preg Test, Ur: NEGATIVE

## 2011-05-21 LAB — HCG, SERUM, QUALITATIVE: Preg, Serum: NEGATIVE

## 2011-05-28 LAB — CBC
HCT: 31.3 — ABNORMAL LOW
Hemoglobin: 10.9 — ABNORMAL LOW
Platelets: 189
Platelets: 222
RBC: 3.94
WBC: 11.3 — ABNORMAL HIGH
WBC: 11.7 — ABNORMAL HIGH

## 2011-05-28 LAB — RPR: RPR Ser Ql: NONREACTIVE

## 2011-06-01 LAB — URINALYSIS, ROUTINE W REFLEX MICROSCOPIC
Bilirubin Urine: NEGATIVE
Nitrite: NEGATIVE
Specific Gravity, Urine: 1.025
pH: 6

## 2011-06-01 LAB — URINE MICROSCOPIC-ADD ON

## 2011-06-01 LAB — GC/CHLAMYDIA PROBE AMP, GENITAL: GC Probe Amp, Genital: NEGATIVE

## 2011-06-07 ENCOUNTER — Other Ambulatory Visit: Payer: Self-pay

## 2011-06-07 DIAGNOSIS — I8310 Varicose veins of unspecified lower extremity with inflammation: Secondary | ICD-10-CM

## 2011-06-14 ENCOUNTER — Encounter: Payer: Self-pay | Admitting: Vascular Surgery

## 2011-06-24 ENCOUNTER — Encounter: Payer: Self-pay | Admitting: Vascular Surgery

## 2011-07-12 ENCOUNTER — Encounter: Payer: Self-pay | Admitting: Vascular Surgery

## 2011-07-13 ENCOUNTER — Encounter: Payer: Self-pay | Admitting: Vascular Surgery

## 2011-07-13 ENCOUNTER — Ambulatory Visit (INDEPENDENT_AMBULATORY_CARE_PROVIDER_SITE_OTHER): Payer: Medicaid Other | Admitting: Vascular Surgery

## 2011-07-13 ENCOUNTER — Other Ambulatory Visit (INDEPENDENT_AMBULATORY_CARE_PROVIDER_SITE_OTHER): Payer: Medicaid Other | Admitting: *Deleted

## 2011-07-13 VITALS — BP 109/58 | HR 71 | Resp 18 | Ht 63.0 in | Wt 183.3 lb

## 2011-07-13 DIAGNOSIS — M79609 Pain in unspecified limb: Secondary | ICD-10-CM

## 2011-07-13 DIAGNOSIS — I83893 Varicose veins of bilateral lower extremities with other complications: Secondary | ICD-10-CM

## 2011-07-13 DIAGNOSIS — I8310 Varicose veins of unspecified lower extremity with inflammation: Secondary | ICD-10-CM

## 2011-07-13 NOTE — Progress Notes (Signed)
Subjective:     Patient ID: [name], female   DOB: 1980-10-08, 30 y.o.   MRN: [id number]  HPI this 30 year old female is evaluated today for venous insufficiency of the right leg with painful varicosities she noted bulging varicosities in her distal right thigh extending over the front of the knee into the pretibial area 4 or 5 years ago. These have become increasingly more prominent and more symptomatic. She complains of aching burning and throbbing discomfort which are, worse as the day progresses the more she is on her feet. She does not wear elastic compression stockings. She does try to elevate her legs as much as possible. She has no history of DVT, thrombophlebitis, stasis ulcers, or bleeding. She has had 3 pregnancies and these symptoms started sometime after her first pregnancy.  Past Medical History  Diagnosis Date  . Cancer     cervix  . GERD (gastroesophageal reflux disease)   . Varicose veins     History  Substance Use Topics  . Smoking status: Never Smoker   . Smokeless tobacco: Not on file  . Alcohol Use: No    Family History  Problem Relation Age of Onset  . Diabetes Mother   . Diabetes Father   . Hypertension Father   . Heart attack Father   . Emphysema Father     No Known Allergies  Current outpatient prescriptions:furosemide (LASIX) 20 MG tablet, Take 20 mg by mouth daily.  , Disp: , Rfl: ;  phentermine 37.5 MG capsule, Take 37.5 mg by mouth every morning.  , Disp: , Rfl: ;  hydrochlorothiazide (HYDRODIURIL) 25 MG tablet, Take 25 mg by mouth daily.  , Disp: , Rfl: ;  sertraline (ZOLOFT) 25 MG tablet, Take 25 mg by mouth daily.  , Disp: , Rfl:   BP 109/58  Pulse 71  Resp 18  Ht 5\' 3"  (1.6 m)  Wt 183 lb 4.8 oz (83.144 kg)  BMI 32.47 kg/m2  Body mass index is 32.47 kg/(m^2).         Review of Systems she denies chest pain, dyspnea on exertion, PND, orthopnea, hemoptysis, lower extremity claudication, or infection in her lower extremities. She does  have a history of reflux esophagitis, and cervical cancer. All other systems are negative complete review of systems     Objective:   Physical Exam pressure 109/58 heart rate 51 respirations 18 General she is a well-developed well-nourished female no apparent distress HEENT normal for age Lungs no rhonchi or wheezing Cardiovascular regular rhythm no murmurs carotid pulses 3+ no audible bruits Abdomen soft nontender with no masses Neurologic exam normal Skin free of rashes Muscle skeletal free major deformities Lower extremity exam reveals 3+ femoral and popliteal and dorsalis pedis pulses palpable bilaterally. She has bulging varicosities in the lower third of the right thigh extending anterior to the knee and into the pretibial region with 1+ edema distally on the right no edema on the left. No varicosities are noted on the left. There is no hyperpigmentation or ulceration noted  Today I ordered a venous duplex exam of the right leg which I reviewed and interpreted. She has gross reflux in the right great saphenous vein in the proximal third of the thigh up to the saphenofemoral junction supplying these bulging varicosities. There is no DVT or deep reflux.    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 If no improvement believe we should proceed with #1 laser ablation of proximal right great saphenous vein with 10-20 stab phlebectomy

## 2011-07-19 NOTE — Procedures (Unsigned)
LOWER EXTREMITY VENOUS REFLUX EXAM  INDICATION:  Right varicose veins.  EXAM:  Using color-flow imaging and pulse Doppler spectral analysis, the right common femoral, superficial femoral, popliteal, posterior tibial, greater and lesser saphenous veins are evaluated.  There is evidence suggesting deep venous insufficiency in the right lower extremity.  The right saphenofemoral junction is not competent with Reflux of >535milliseconds. The right GSV is not competent with Reflux of >576milliseconds with the caliber as described below.)   The right proximal short saphenous vein demonstrates competency.  GSV Diameter (used if found to be incompetent only)                                           Right    Left Proximal Greater Saphenous Vein           0.43 cm  cm Proximal-to-mid-thigh                     0.45 cm  cm Mid thigh                                 0.21 cm  cm Mid-distal thigh                          cm       cm Distal thigh                              0.30 cm  cm Knee                                      cm       cm  IMPRESSION: 1. The right greater saphenous vein is not competent with reflux     >58milliseconds. 2. The right greater saphenous vein is not tortuous. 3. The deep venous system is not competent with Reflux of     >568milliseconds. 4. The right lesser saphenous vein is competent.  ___________________________________________ Quita Skye. Hart Rochester, M.D.  EM/MEDQ  D:  07/13/2011  T:  07/13/2011  Job:  [id number]

## 2011-10-12 ENCOUNTER — Ambulatory Visit: Payer: Medicaid Other | Admitting: Vascular Surgery

## 2011-11-08 ENCOUNTER — Encounter: Payer: Self-pay | Admitting: Vascular Surgery

## 2011-11-09 ENCOUNTER — Encounter: Payer: Self-pay | Admitting: Vascular Surgery

## 2011-11-09 ENCOUNTER — Ambulatory Visit (INDEPENDENT_AMBULATORY_CARE_PROVIDER_SITE_OTHER): Payer: Medicaid Other | Admitting: Vascular Surgery

## 2011-11-09 VITALS — BP 127/81 | HR 77 | Resp 16 | Ht 63.0 in | Wt 183.0 lb

## 2011-11-09 DIAGNOSIS — I83893 Varicose veins of bilateral lower extremities with other complications: Secondary | ICD-10-CM | POA: Insufficient documentation

## 2011-11-09 NOTE — Progress Notes (Signed)
Subjective:     Patient ID: [name], female   DOB: [birth year], 31 y.o.   MRN: [id number]  HPI this 31 year old female returns for continued followup regarding her right leg venous insufficiency. She has bulging varicosities which are quite symptomatic causing aching throbbing and burning discomfort. This begins in the lateral thigh extends down into the pretibial region. She also has swelling in the right ankle as the day progresses. She has worn long leg elastic compression stockings 20-30 mm gradient as well as trying elevation and ibuprofen with no success. She has no symptoms in the contralateral left leg  Past Medical History  Diagnosis Date  . Cancer     cervix  . GERD (gastroesophageal reflux disease)   . Varicose veins     History  Substance Use Topics  . Smoking status: Never Smoker   . Smokeless tobacco: Not on file  . Alcohol Use: No    Family History  Problem Relation Age of Onset  . Diabetes Mother   . Diabetes Father   . Hypertension Father   . Heart attack Father   . Emphysema Father     No Known Allergies  Current outpatient prescriptions:furosemide (LASIX) 20 MG tablet, Take 20 mg by mouth daily.  , Disp: , Rfl: ;  hydrochlorothiazide (HYDRODIURIL) 25 MG tablet, Take 25 mg by mouth daily.  , Disp: , Rfl: ;  phentermine 37.5 MG capsule, Take 37.5 mg by mouth every morning.  , Disp: , Rfl: ;  sertraline (ZOLOFT) 25 MG tablet, Take 25 mg by mouth daily.  , Disp: , Rfl:   BP 127/81  Pulse 77  Resp 16  Ht 5\' 3"  (1.6 m)  Wt 183 lb (83.008 kg)  BMI 32.42 kg/m2  Body mass index is 32.42 kg/(m^2).         Review of Systems     Objective:   Physical Exam blood pressure 127/81 heart rate 77 respirations 16 General well-developed well-nourished female in no apparent distress alert and oriented x3 HEENT normal for age Lower extremity exam reveals bulging varicosities in the right anterior thigh extending down the lateral to the knee. She has 1+ edema  distally. No ulcers or infection are noted.    Assessment:     Venous insufficiency right leg with reflux right great saphenous vein supplying bulging varicosities. These are painful and affecting her daily living and resistant to conservative measures    Plan:     Plan laser ablation of proximal right great saphenous vein with 10-20 stab phlebectomy. We'll schedule the near future to relieve her symptoms

## 2011-11-19 ENCOUNTER — Encounter: Payer: Self-pay | Admitting: Vascular Surgery

## 2011-11-22 ENCOUNTER — Encounter: Payer: Self-pay | Admitting: Vascular Surgery

## 2011-11-22 ENCOUNTER — Ambulatory Visit (INDEPENDENT_AMBULATORY_CARE_PROVIDER_SITE_OTHER): Payer: Medicaid Other | Admitting: Vascular Surgery

## 2011-11-22 VITALS — BP 115/74 | HR 89 | Resp 16 | Ht 63.0 in | Wt 185.0 lb

## 2011-11-22 DIAGNOSIS — I83893 Varicose veins of bilateral lower extremities with other complications: Secondary | ICD-10-CM

## 2011-11-22 NOTE — Progress Notes (Signed)
Subjective:     Patient ID: [name], female   DOB: Apr 12, 1981, 31 y.o.   MRN: [id number]  HPI this 31 year old female had laser ablation of the right great saphenous vein from the distal thigh to the saphenofemoral junction performed under local tumescent anesthesia. She also had 10-20 stab phlebectomy is painful varicosities in the thigh and pretibial area. She tolerated both procedures well.   Review of Systems     Objective:   Physical ExamBP 115/74  Pulse 89  Resp 16  Ht 5\' 3"  (1.6 m)  Wt 185 lb (83.915 kg)  BMI 32.77 kg/m2       Assessment:      well tolerated laser ablation right great saphenous vein and 10-20 stab phlebectomy done under local tumescent anesthesia for painful varicosities Plan:     Return one week for venous duplex exam to confirm closure right great saphenous vein

## 2011-11-22 NOTE — Progress Notes (Signed)
Laser Ablation Procedure      Date: 11/22/2011    [name] DOB:[birth year]  Consent signed: Yes  Surgeon:J.D. Hart Rochester  Procedure: Laser Ablation: right Greater Saphenous Vein  BP 115/74  Pulse 89  Resp 16  Ht 5\' 3"  (1.6 m)  Wt 185 lb (83.915 kg)  BMI 32.77 kg/m2  Start time: 11:20   End time: 12:20  Tumescent Anesthesia: 400 cc 0.9% NaCl with 50 cc Lidocaine HCL with 1% Epi and 15 cc 8.4% NaHCO3  Local Anesthesia: 17 cc Lidocaine HCL and NaHCO3 (ratio 2:1)  Pulsed mode: Watts 15 Seconds 1 Pulses:1 Total Pulses:67 Total Energy: 1002 Total Time: 1:06     Stab Phlebectomy: 10-20 Sites: Thigh and Calf  Patient tolerated procedure well: Yes  Description of Procedure:  After marking the course of the saphenous vein and the secondary varicosities in the standing position, the patient was placed on the operating table in the supine position, and the right leg was prepped and draped in sterile fashion. Local anesthetic was administered, and under ultrasound guidance the saphenous vein was accessed with a micro needle and guide wire; then the micro puncture sheath was placed. A guide wire was inserted to the saphenofemoral junction, followed by a 5 french sheath.  The position of the sheath and then the laser fiber below the junction was confirmed using the ultrasound and visualization of the aiming beam.  Tumescent anesthesia was administered along the course of the saphenous vein using ultrasound guidance. Protective laser glasses were placed on the patient, and the laser was fired at 15 watt pulsed mode advancing 1-2 mm per sec.  For a total of 1002 joules.  A steri strip was applied to the puncture site.  The patient was then put into Trendelenburg position.  Local anesthetic was utilized overlying the marked varicosities.  Greater than 10-20 stab wounds were made using the tip of an 11 blade; and using the vein hook,  The phlebectomies were performed using a hemostat to avulse these  varicosities.  Adequate hemostasis was achieved, and steri strips were applied to the stab wound.     ABD pads and thigh high compression stockings were applied.  Ace wrap bandages were applied over the phlebectomy sites and at the top of the saphenofemoral junction.  Blood loss was less than 15 cc.  The patient ambulated out of the operating room having tolerated the procedure well.

## 2011-11-23 ENCOUNTER — Encounter: Payer: Self-pay | Admitting: Vascular Surgery

## 2011-11-23 ENCOUNTER — Telehealth: Payer: Self-pay | Admitting: *Deleted

## 2011-11-23 NOTE — Telephone Encounter (Signed)
Reached patient at home. No bleeding just a little pain. Did say her foot is swollen. No pain in calf. Told her to loosen the acewraps and continue to use ice and elevation. Told her to call me back if the swelling of her foot doesn't improve.

## 2011-11-24 ENCOUNTER — Other Ambulatory Visit: Payer: Self-pay | Admitting: *Deleted

## 2011-11-24 DIAGNOSIS — I83893 Varicose veins of bilateral lower extremities with other complications: Secondary | ICD-10-CM

## 2011-12-03 ENCOUNTER — Encounter: Payer: Self-pay | Admitting: Surgery

## 2011-12-06 ENCOUNTER — Ambulatory Visit: Payer: Medicaid Other | Admitting: Vascular Surgery

## 2011-12-27 ENCOUNTER — Encounter: Payer: Self-pay | Admitting: Vascular Surgery

## 2011-12-28 ENCOUNTER — Ambulatory Visit (INDEPENDENT_AMBULATORY_CARE_PROVIDER_SITE_OTHER): Payer: Medicaid Other | Admitting: Vascular Surgery

## 2011-12-28 ENCOUNTER — Encounter (INDEPENDENT_AMBULATORY_CARE_PROVIDER_SITE_OTHER): Payer: Medicaid Other | Admitting: *Deleted

## 2011-12-28 ENCOUNTER — Encounter: Payer: Self-pay | Admitting: Vascular Surgery

## 2011-12-28 VITALS — BP 115/66 | HR 76 | Resp 16 | Ht 63.0 in | Wt 184.0 lb

## 2011-12-28 DIAGNOSIS — I83893 Varicose veins of bilateral lower extremities with other complications: Secondary | ICD-10-CM
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
## 2012-01-06 NOTE — Procedures (Unsigned)
DUPLEX DEEP VENOUS EXAM - LOWER EXTREMITY  INDICATION:  Varicose veins with other complication.  HISTORY:  Edema:  No. Trauma/Surgery:  Right great saphenous vein laser ablation on 11/22/2011. Pain:  Right thigh tenderness. PE:  No. Previous DVT:  No. Anticoagulants: Other:  DUPLEX EXAM:               CFV   SFV   PopV  PTV    GSV               R  L  R  L  R  L  R   L  R  L Thrombosis    o  o  o     o     o      0 Spontaneous   +  +  +     +     +      0 Phasic        +  +  +     +     +      0 Augmentation  +  +  +     +     +      0 Compressible  +  +  +     +     +      0 Competent     0  0  +     0     +      0  Legend:  + - yes  o - no  p - partial  D - decreased  IMPRESSION: 1. No evidence of deep vein thrombosis noted in the right lower     extremity. 2. The right great saphenous vein appears totally occluded at the     proximal thigh and extends into an anterior branch in the mid thigh     region.  The right great saphenous vein from the knee to mid thigh     level appears widely patent. 3. Reflux of >561milliseconds noted in the right popliteal and     bilateral common femoral veins.  No reflux is noted within the     right saphenofemoral junction.   _____________________________ Quita Skye Hart Rochester, M.D.  CH/MEDQ  D:  12/31/2011  T:  12/31/2011  Job:  [id number]

## 2018-03-20 ENCOUNTER — Other Ambulatory Visit: Payer: Self-pay

## 2018-03-20 DIAGNOSIS — I83811 Varicose veins of right lower extremities with pain: Secondary | ICD-10-CM

## 2018-07-06 ENCOUNTER — Other Ambulatory Visit: Payer: Self-pay

## 2018-07-06 ENCOUNTER — Ambulatory Visit (INDEPENDENT_AMBULATORY_CARE_PROVIDER_SITE_OTHER): Payer: Medicaid Other | Admitting: Vascular Surgery

## 2018-07-06 ENCOUNTER — Ambulatory Visit (HOSPITAL_COMMUNITY)
Admission: RE | Admit: 2018-07-06 | Discharge: 2018-07-06 | Disposition: A | Discharge status: Home / Self Care | Payer: Medicaid Other | Source: Ambulatory Visit | Attending: Vascular Surgery | Admitting: Vascular Surgery

## 2018-07-06 ENCOUNTER — Encounter

## 2018-07-06 ENCOUNTER — Encounter: Payer: Self-pay | Admitting: Vascular Surgery

## 2018-07-06 VITALS — BP 119/75 | HR 84 | Temp 97.1°F | Resp 16 | Ht 65.0 in | Wt 208.6 lb

## 2018-07-06 DIAGNOSIS — I83813 Varicose veins of bilateral lower extremities with pain: Secondary | ICD-10-CM

## 2018-07-06 DIAGNOSIS — I83811 Varicose veins of right lower extremities with pain: Secondary | ICD-10-CM | POA: Diagnosis not present

## 2018-07-06 NOTE — Progress Notes (Signed)
Patient name: [name] MRN: [id number] DOB: 1981-02-14 Sex: female  HPI: KAMANI MAGNUSSEN is a 37 y.o. female who underwent laser ablation of her right greater saphenous vein by my partner Dr. Kellie Simmering in 2013.  This was done for heaviness aching sensation in her right leg.  She had reflux documented at that time.  She states she did okay for a few years after the procedure but her symptoms have been slowly returning over the last year.  She also complains of similar symptoms in her left leg.  She states that she gets full heaviness and achiness as the day progresses.  She does develop swelling in her legs which is somewhat relieved by elevation.  She works as a Therapist, sports.  She states she has started wearing her compression stockings again and this has helped with the symptoms somewhat but not completely relieve them.  She does notice that her veins bulge more than they used to although she does not have one specific prominent varicosity.  She denies prior history of DVT.  Past Medical History:  Diagnosis Date  . Cancer ([place])    cervix  . GERD (gastroesophageal reflux disease)   . Varicose veins    Past Surgical History:  Procedure Laterality Date  . ABDOMINAL HYSTERECTOMY    . LEEP  [id number]  . TONSILECTOMY, ADENOIDECTOMY, BILATERAL MYRINGOTOMY AND TUBES      Family History  Problem Relation Age of Onset  . Diabetes Mother   . Diabetes Father   . Hypertension Father   . Heart attack Father   . Emphysema Father     SOCIAL HISTORY: Social History   Socioeconomic History  . Marital status: Single    Spouse name: Not on file  . Number of children: Not on file  . Years of education: Not on file  . Highest education level: Not on file  Occupational History  . Not on file  Social Needs  . Financial resource strain: Not on file  . Food insecurity:    Worry: Not on file    Inability: Not on file  . Transportation needs:    Medical: Not on file    Non-medical: Not  on file  Tobacco Use  . Smoking status: Never Smoker  . Smokeless tobacco: Never Used  Substance and Sexual Activity  . Alcohol use: No  . Drug use: No  . Sexual activity: Not on file  Lifestyle  . Physical activity:    Days per week: Not on file    Minutes per session: Not on file  . Stress: Not on file  Relationships  . Social connections:    Talks on phone: Not on file    Gets together: Not on file    Attends religious service: Not on file    Active member of club or organization: Not on file    Attends meetings of clubs or organizations: Not on file    Relationship status: Not on file  . Intimate partner violence:    Fear of current or ex partner: Not on file    Emotionally abused: Not on file    Physically abused: Not on file    Forced sexual activity: Not on file  Other Topics Concern  . Not on file  Social History Narrative  . Not on file    No Known Allergies  Current Outpatient Medications  Medication Sig Dispense Refill  . furosemide (LASIX) 20 MG tablet Take 20 mg by mouth daily.      Marland Kitchen  hydrochlorothiazide (HYDRODIURIL) 25 MG tablet Take 25 mg by mouth daily.      . phentermine 37.5 MG capsule Take 37.5 mg by mouth every morning.      . sertraline (ZOLOFT) 25 MG tablet Take 25 mg by mouth daily.       No current facility-administered medications for this visit.     ROS:   General:  No weight loss, Fever, chills  HEENT: No recent headaches, no nasal bleeding, no visual changes, no sore throat  Neurologic: No dizziness, blackouts, seizures. No recent symptoms of stroke or mini- stroke. No recent episodes of slurred speech, or temporary blindness.  Cardiac: No recent episodes of chest pain/pressure, no shortness of breath at rest.  No shortness of breath with exertion.  Denies history of atrial fibrillation or irregular heartbeat  Vascular: No history of rest pain in feet.  No history of claudication.  No history of non-healing ulcer, No history of DVT    Pulmonary: No home oxygen, no productive cough, no hemoptysis,  No asthma or wheezing  Musculoskeletal:  [ ]  Arthritis, [ ]  Low back pain,  [ ]  Joint pain  Hematologic:No history of hypercoagulable state.  No history of easy bleeding.  No history of anemia  Gastrointestinal: No hematochezia or melena,  No gastroesophageal reflux, no trouble swallowing  Urinary: [ ]  chronic Kidney disease, [ ]  on HD - [ ]  MWF or [ ]  TTHS, [ ]  Burning with urination, [ ]  Frequent urination, [ ]  Difficulty urinating;   Skin: No rashes  Psychological: No history of anxiety,  No history of depression   Physical Examination  Vitals:   07/06/18 1254  BP: 119/75  Pulse: 84  Resp: 16  Temp: (!) 97.1 F (36.2 C)  TempSrc: Oral  SpO2: 98%  Weight: 208 lb 9.6 oz (94.6 kg)  Height: 5\' 5"  (1.651 m)    Body mass index is 34.71 kg/m.  General:  Alert and oriented, no acute distress HEENT: Normal Neck: No bruit or JVD Pulmonary: Clear to auscultation bilaterally Cardiac: Regular Rate and Rhythm without murmur Abdomen: Soft, non-tender, non-distended, no mass, obese Skin: No rash, few scattered spider type varicosities right medial thigh Extremity Pulses:  2+ radial, brachial, femoral, dorsalis pedis, posterior tibial pulses bilaterally Musculoskeletal: No deformity or edema  Neurologic: Upper and lower extremity motor 5/5 and symmetric  DATA:  Patient had a venous duplex ultrasound for reflux today.  This showed reflux in the right common femoral vein as well as the right greater saphenous vein from the proximal thigh to the saphenofemoral junction.  Vein diameter was 4 to 6 mm.  I also reexamined the patient at the bedside with the SonoSite ultrasound.  Again from the mid thigh up to the saphenofemoral junction the vein diameter was about 4 to 5 mm on my exam.  Of note I also examined the left greater saphenous vein and this is 4 to 5 mm in diameter similar from the mid thigh up to the  groin.  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  Although a reflux study was not performed of the left leg today she did have reflux in the right greater saphenous and right common femoral vein.  I suspect she also has reflux in the left greater saphenous vein.  This is symptomatic with pain aching and swelling.   PLAN: Patient was given a new prescription for thigh-high compression stockings today.  She will get these from [facility].  She will return in 3 months time to consider possible repeat laser ablation or ligation and stripping of her right greater saphenous vein.  We will also perform a duplex ultrasound of her left lower extremity as I suspect she has reflux in this leg as well.  If she does have reflux confirmed in the left leg we would also consider laser ablation on that side.  This is all pending insurance approval.  She understands and agrees to proceed.   Ruta Hinds, MD [facility] Office: [phone] Pager: [phone]

## 2018-08-25 ENCOUNTER — Other Ambulatory Visit: Payer: Self-pay

## 2018-08-25 DIAGNOSIS — I83813 Varicose veins of bilateral lower extremities with pain: Secondary | ICD-10-CM

## 2018-08-25 DIAGNOSIS — I83811 Varicose veins of right lower extremities with pain: Secondary | ICD-10-CM

## 2018-10-18 ENCOUNTER — Ambulatory Visit (HOSPITAL_COMMUNITY)
Admission: RE | Admit: 2018-10-18 | Discharge: 2018-10-18 | Disposition: A | Discharge status: Home / Self Care | Payer: Self-pay | Source: Ambulatory Visit | Attending: Family | Admitting: Family

## 2018-10-18 ENCOUNTER — Encounter: Payer: Self-pay | Admitting: Vascular Surgery

## 2018-10-18 ENCOUNTER — Other Ambulatory Visit: Payer: Self-pay

## 2018-10-18 ENCOUNTER — Ambulatory Visit (INDEPENDENT_AMBULATORY_CARE_PROVIDER_SITE_OTHER): Payer: Self-pay | Admitting: Vascular Surgery

## 2018-10-18 VITALS — BP 116/81 | HR 78 | Temp 98.5°F | Resp 18 | Ht 64.0 in | Wt 209.0 lb

## 2018-10-18 DIAGNOSIS — I83813 Varicose veins of bilateral lower extremities with pain: Secondary | ICD-10-CM

## 2018-10-18 NOTE — Progress Notes (Signed)
Patient is a 38 year old female who returns for follow-up today.  She previously underwent laser ablation of her right greater saphenous by Dr. Kellie Simmering in 2013.  She has recurrent symptoms of heaviness fullness and aching and pain in both lower extremities.  She was placed back and lower extremity compression stockings in November.  She returns today for further follow-up.  She states her legs hurt from the hips all the way down.  She does notice some improvement of her symptoms with compression therapy.  Physical exam:  Vitals:   10/18/18 1431  BP: 116/81  Pulse: 78  Resp: 18  Temp: 98.5 F (36.9 C)  TempSrc: Oral  SpO2: 98%  Weight: 209 lb (94.8 kg)  Height: 5\' 4"  (1.626 m)    Extremities: No obvious prominent skin surface varicosities.  Data: Patient had a venous duplex exam of her left greater saphenous today which did show reflux but the vein was fairly small about 3 mm in several areas.  It did dilate up as much as 5 mm at the saphenofemoral junction.  I repeated portions of her ultrasound of her right and left leg today with the SonoSite at the bedside.  The right lower extremity has some tributary veins in the mid thigh and the vein slightly enlarges here but it is only about 3 mm in diameter all the way to the saphenofemoral junction.  In the leftt leg most of the vein was less than 3 mm in diameter on my exam.  Assessment: Symptomatic varicose veins bilateral lower extremities although the saphenous does have reflux in it it is a fairly small vein and I do not believe she would greatly benefit from laser ablation of this at its current diameter.  Plan: We will keep the patient in lower extremity compression stockings for symptomatic relief.  If she notices her symptoms are getting worse over the next 6 months to a year we could revisit whether or not to repeat her ultrasound to see if the vein diameter is enlarging over time.  She will follow-up on an as-needed basis.  Ruta Hinds, MD [facility] Office: [phone] Pager: [phone]

## 2023-06-22 ENCOUNTER — Other Ambulatory Visit: Payer: Self-pay | Admitting: Orthopedic Surgery

## 2023-06-22 DIAGNOSIS — M545 Low back pain, unspecified: Secondary | ICD-10-CM

## 2023-08-22 ENCOUNTER — Other Ambulatory Visit: Payer: Self-pay | Admitting: Orthopedic Surgery

## 2023-08-22 DIAGNOSIS — M545 Low back pain, unspecified: Secondary | ICD-10-CM

## 2023-09-02 ENCOUNTER — Ambulatory Visit: Admission: RE | Admit: 2023-09-02 | Payer: Medicaid Other | Source: Ambulatory Visit

## 2023-09-02 ENCOUNTER — Other Ambulatory Visit: Payer: Self-pay | Admitting: Orthopedic Surgery

## 2023-09-02 ENCOUNTER — Ambulatory Visit
Admission: RE | Admit: 2023-09-02 | Discharge: 2023-09-02 | Disposition: A | Discharge status: Home / Self Care | Payer: Medicaid Other | Source: Ambulatory Visit | Attending: Orthopedic Surgery | Admitting: Orthopedic Surgery

## 2023-09-02 DIAGNOSIS — M545 Low back pain, unspecified: Secondary | ICD-10-CM

## 2023-09-02 MED ORDER — METHYLPREDNISOLONE ACETATE 40 MG/ML INJ SUSP (RADIOLOG
80.0000 mg | Freq: Once | INTRAMUSCULAR | Status: AC
  Administered 2023-09-02: 80 mg via INTRA_ARTICULAR
